# Patient Record
Sex: Male | Born: 1970 | Race: Black or African American | Hispanic: No | Marital: Married | Smoking: Never smoker
Health system: Southern US, Community
[De-identification: ages and names within clinical notes are randomized; demographics above are authoritative.]

## PROBLEM LIST (undated history)

## (undated) DIAGNOSIS — W3400XA Accidental discharge from unspecified firearms or gun, initial encounter: Secondary | ICD-10-CM

## (undated) HISTORY — PX: CHEST SURGERY: SHX595

## (undated) HISTORY — PX: EYE SURGERY: SHX253

---

## 1998-07-30 ENCOUNTER — Emergency Department (HOSPITAL_COMMUNITY): Admission: EM | Admit: 1998-07-30 | Discharge: 1998-07-30 | Payer: Self-pay | Admitting: Emergency Medicine

## 2005-06-25 ENCOUNTER — Emergency Department (HOSPITAL_COMMUNITY): Admission: EM | Admit: 2005-06-25 | Discharge: 2005-06-26 | Payer: Self-pay | Admitting: Emergency Medicine

## 2009-04-01 ENCOUNTER — Emergency Department (HOSPITAL_COMMUNITY): Admission: AC | Admit: 2009-04-01 | Discharge: 2009-04-01 | Payer: Self-pay

## 2011-01-24 LAB — TYPE AND SCREEN
ABO/RH(D): B POS
Antibody Screen: NEGATIVE

## 2011-01-24 LAB — CBC
HCT: 40.5 % (ref 39.0–52.0)
HCT: 40.8 % (ref 39.0–52.0)
Hemoglobin: 13.8 g/dL (ref 13.0–17.0)
Hemoglobin: 13.8 g/dL (ref 13.0–17.0)
Hemoglobin: 14.1 g/dL (ref 13.0–17.0)
MCHC: 33.9 g/dL (ref 30.0–36.0)
MCHC: 33.9 g/dL (ref 30.0–36.0)
MCHC: 34.3 g/dL (ref 30.0–36.0)
MCV: 86.9 fL (ref 78.0–100.0)
MCV: 87.6 fL — ABNORMAL LOW (ref 78.0–100.0)
RBC: 4.63 MIL/uL (ref 4.22–5.81)
RBC: 4.7 MIL/uL (ref 4.22–5.81)
RBC: 4.75 MIL/uL (ref 4.22–5.81)

## 2011-01-24 LAB — PROTIME-INR: Prothrombin Time: 16.3 seconds — ABNORMAL HIGH (ref 11.6–15.2)

## 2011-01-24 LAB — DIFFERENTIAL
Basophils Relative: 0 % (ref 0–1)
Eosinophils Absolute: 0 10*3/uL (ref 0.0–0.7)
Monocytes Absolute: 2.8 10*3/uL — ABNORMAL HIGH (ref 0.1–1.0)
Neutro Abs: 22.8 10*3/uL — ABNORMAL HIGH (ref 1.7–7.7)
Neutrophils Relative %: 82 % — ABNORMAL HIGH (ref 43–77)

## 2011-01-24 LAB — POCT I-STAT, CHEM 8
BUN: 12 mg/dL (ref 6–23)
Chloride: 101 meq/L (ref 96–112)
Glucose, Bld: 161 mg/dL — ABNORMAL HIGH (ref 70–99)
HCT: 41 % (ref 39.0–52.0)
Potassium: 2.7 meq/L — CL (ref 3.5–5.1)

## 2015-11-01 ENCOUNTER — Encounter (HOSPITAL_COMMUNITY): Payer: Self-pay | Admitting: *Deleted

## 2015-11-01 ENCOUNTER — Emergency Department (INDEPENDENT_AMBULATORY_CARE_PROVIDER_SITE_OTHER)
Admission: EM | Admit: 2015-11-01 | Discharge: 2015-11-01 | Disposition: A | Discharge status: Nursing Home (Includes ICF) | Payer: BLUE CROSS/BLUE SHIELD | Source: Home / Self Care | Attending: Emergency Medicine | Admitting: Emergency Medicine

## 2015-11-01 ENCOUNTER — Encounter (HOSPITAL_COMMUNITY): Payer: Self-pay | Admitting: Emergency Medicine

## 2015-11-01 ENCOUNTER — Emergency Department (HOSPITAL_COMMUNITY)
Admission: EM | Admit: 2015-11-01 | Discharge: 2015-11-01 | Disposition: A | Discharge status: Home / Self Care | Payer: BLUE CROSS/BLUE SHIELD | Attending: Emergency Medicine | Admitting: Emergency Medicine

## 2015-11-01 ENCOUNTER — Emergency Department (HOSPITAL_COMMUNITY): Payer: BLUE CROSS/BLUE SHIELD

## 2015-11-01 DIAGNOSIS — R0789 Other chest pain: Secondary | ICD-10-CM | POA: Insufficient documentation

## 2015-11-01 DIAGNOSIS — Z87828 Personal history of other (healed) physical injury and trauma: Secondary | ICD-10-CM | POA: Insufficient documentation

## 2015-11-01 DIAGNOSIS — R9431 Abnormal electrocardiogram [ECG] [EKG]: Secondary | ICD-10-CM

## 2015-11-01 DIAGNOSIS — R079 Chest pain, unspecified: Secondary | ICD-10-CM

## 2015-11-01 HISTORY — DX: Accidental discharge from unspecified firearms or gun, initial encounter: W34.00XA

## 2015-11-01 LAB — I-STAT TROPONIN, ED: TROPONIN I, POC: 0.01 ng/mL (ref 0.00–0.08)

## 2015-11-01 LAB — BASIC METABOLIC PANEL
Anion gap: 10 (ref 5–15)
BUN: 6 mg/dL (ref 6–20)
CALCIUM: 9.3 mg/dL (ref 8.9–10.3)
CHLORIDE: 103 mmol/L (ref 101–111)
CO2: 26 mmol/L (ref 22–32)
CREATININE: 1.01 mg/dL (ref 0.61–1.24)
GFR calc Af Amer: >60 mL/min (ref >60)
GFR calc non Af Amer: >60 mL/min (ref >60)
Glucose, Bld: 103 mg/dL — ABNORMAL HIGH (ref 65–99)
Potassium: 4 mmol/L (ref 3.5–5.1)
Sodium: 139 mmol/L (ref 135–145)

## 2015-11-01 LAB — CBC
HCT: 46.5 % (ref 39.0–52.0)
Hemoglobin: 16.2 g/dL (ref 13.0–17.0)
MCH: 28.7 pg (ref 26.0–34.0)
MCHC: 34.8 g/dL (ref 30.0–36.0)
MCV: 82.3 fL (ref 78.0–100.0)
PLATELETS: 224 10*3/uL (ref 150–400)
RBC: 5.65 MIL/uL (ref 4.22–5.81)
RDW: 11.9 % (ref 11.5–15.5)
WBC: 15.8 10*3/uL — ABNORMAL HIGH (ref 4.0–10.5)

## 2015-11-01 LAB — TROPONIN I: Troponin I: <0.03 ng/mL (ref <0.031)

## 2015-11-01 NOTE — Discharge Instructions (Signed)
As discussed, your evaluation today has been largely reassuring.  But, it is important that you monitor your condition carefully, and do not hesitate to return to the ED if you develop new, or concerning changes in your condition. ? ?Otherwise, please follow-up with your physician for appropriate ongoing care. ? ?

## 2015-11-01 NOTE — ED Notes (Signed)
Pt sent from urgent care for further eval of left sided chest pain onset 3 hours PTA. Pt reports pain onset while laying down. Pt denies any other symptoms.
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
## 2015-11-01 NOTE — Discharge Instructions (Signed)
You need to be seen in the ER for your chest pain

## 2015-11-01 NOTE — ED Notes (Signed)
Pt to be transferred to ED via private vehicle per PA.  [facility]

## 2017-09-26 ENCOUNTER — Ambulatory Visit (HOSPITAL_COMMUNITY)
Admission: EM | Admit: 2017-09-26 | Discharge: 2017-09-26 | Disposition: A | Discharge status: Home / Self Care | Payer: BLUE CROSS/BLUE SHIELD | Attending: Internal Medicine | Admitting: Internal Medicine

## 2017-09-26 ENCOUNTER — Encounter (HOSPITAL_COMMUNITY): Payer: Self-pay | Admitting: Emergency Medicine

## 2017-09-26 DIAGNOSIS — R109 Unspecified abdominal pain: Secondary | ICD-10-CM | POA: Insufficient documentation

## 2017-09-26 LAB — COMPREHENSIVE METABOLIC PANEL
ALT: 17 U/L (ref 17–63)
AST: 19 U/L (ref 15–41)
Albumin: 3.9 g/dL (ref 3.5–5.0)
Alkaline Phosphatase: 74 U/L (ref 38–126)
Anion gap: 6 (ref 5–15)
BUN: 8 mg/dL (ref 6–20)
CHLORIDE: 105 mmol/L (ref 101–111)
CO2: 27 mmol/L (ref 22–32)
CREATININE: 0.86 mg/dL (ref 0.61–1.24)
Calcium: 9.2 mg/dL (ref 8.9–10.3)
GFR calc Af Amer: >60 mL/min (ref >60)
GFR calc non Af Amer: >60 mL/min (ref >60)
Glucose, Bld: 95 mg/dL (ref 65–99)
POTASSIUM: 3.6 mmol/L (ref 3.5–5.1)
SODIUM: 138 mmol/L (ref 135–145)
Total Bilirubin: 1 mg/dL (ref 0.3–1.2)
Total Protein: 7.4 g/dL (ref 6.5–8.1)

## 2017-09-26 NOTE — ED Triage Notes (Signed)
PT reports "pressure" over RUQ. PT reports it improves in certain positions. PT denies N/V/D and constipation. This has been going on for 4 week.s

## 2017-09-26 NOTE — Discharge Instructions (Addendum)
We are check your liver and kidney function. We will call you if anything comes back abnormal.  In the mean time, please avoid any activity that make the pressure worse.   Please return if you start to experience any fever, nausea, vomiting, abdominal pain, chest pain, shortness of breath, changes in your symptoms.

## 2017-09-26 NOTE — ED Triage Notes (Signed)
Denies dysuria 
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
## 2018-11-01 ENCOUNTER — Other Ambulatory Visit: Payer: Self-pay

## 2018-11-01 ENCOUNTER — Emergency Department (HOSPITAL_COMMUNITY)
Admission: EM | Admit: 2018-11-01 | Discharge: 2018-11-01 | Disposition: A | Discharge status: Home / Self Care | Payer: BLUE CROSS/BLUE SHIELD | Attending: Emergency Medicine | Admitting: Emergency Medicine

## 2018-11-01 ENCOUNTER — Encounter (HOSPITAL_COMMUNITY): Payer: Self-pay | Admitting: Emergency Medicine

## 2018-11-01 DIAGNOSIS — S39012A Strain of muscle, fascia and tendon of lower back, initial encounter: Secondary | ICD-10-CM | POA: Diagnosis present

## 2018-11-01 DIAGNOSIS — Y999 Unspecified external cause status: Secondary | ICD-10-CM | POA: Diagnosis not present

## 2018-11-01 DIAGNOSIS — Y9389 Activity, other specified: Secondary | ICD-10-CM | POA: Insufficient documentation

## 2018-11-01 DIAGNOSIS — Y929 Unspecified place or not applicable: Secondary | ICD-10-CM | POA: Diagnosis not present

## 2018-11-01 MED ORDER — NAPROXEN 500 MG PO TABS: 500.0000 mg | ORAL_TABLET | Freq: Two times a day (BID) | ORAL | 0 refills | Status: AC | PRN

## 2018-11-01 MED ORDER — NAPROXEN 250 MG PO TABS
500.0000 mg | ORAL_TABLET | Freq: Once | ORAL | Status: AC
  Administered 2018-11-01: 500 mg via ORAL
  Filled 2018-11-01: qty 2

## 2018-11-01 MED ORDER — METHOCARBAMOL 500 MG PO TABS: 500.0000 mg | ORAL_TABLET | Freq: Three times a day (TID) | ORAL | 0 refills | Status: AC | PRN

## 2018-11-01 MED ORDER — METHOCARBAMOL 500 MG PO TABS
500.0000 mg | ORAL_TABLET | Freq: Once | ORAL | Status: AC
  Administered 2018-11-01: 500 mg via ORAL
  Filled 2018-11-01: qty 1

## 2018-11-01 NOTE — Discharge Instructions (Addendum)
Use the muscle relaxers and anti-inflammatories as prescribed.  Follow-up with your doctor.  Return to the ED with worsening pain, weakness, numbness, tingling or any other concerns.
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
## 2021-03-20 ENCOUNTER — Other Ambulatory Visit: Payer: Self-pay

## 2021-03-20 ENCOUNTER — Emergency Department (HOSPITAL_COMMUNITY): Payer: BC Managed Care – PPO

## 2021-03-20 ENCOUNTER — Emergency Department (HOSPITAL_COMMUNITY)
Admission: EM | Admit: 2021-03-20 | Discharge: 2021-03-20 | Disposition: A | Discharge status: Home / Self Care | Payer: BC Managed Care – PPO | Attending: Emergency Medicine | Admitting: Emergency Medicine

## 2021-03-20 ENCOUNTER — Encounter (HOSPITAL_COMMUNITY): Payer: Self-pay

## 2021-03-20 DIAGNOSIS — R0789 Other chest pain: Secondary | ICD-10-CM | POA: Diagnosis not present

## 2021-03-20 LAB — CBC
HCT: 47.7 % (ref 39.0–52.0)
Hemoglobin: 16.1 g/dL (ref 13.0–17.0)
MCH: 28.5 pg (ref 26.0–34.0)
MCHC: 33.8 g/dL (ref 30.0–36.0)
MCV: 84.4 fL (ref 80.0–100.0)
Platelets: 249 10*3/uL (ref 150–400)
RBC: 5.65 MIL/uL (ref 4.22–5.81)
RDW: 11.7 % (ref 11.5–15.5)
WBC: 11.3 10*3/uL — ABNORMAL HIGH (ref 4.0–10.5)
nRBC: 0 % (ref 0.0–0.2)

## 2021-03-20 LAB — D-DIMER, QUANTITATIVE: D-Dimer, Quant: 0.55 ug/mL-FEU — ABNORMAL HIGH (ref 0.00–0.50)

## 2021-03-20 LAB — BASIC METABOLIC PANEL
Anion gap: 6 (ref 5–15)
BUN: 8 mg/dL (ref 6–20)
CO2: 28 mmol/L (ref 22–32)
Calcium: 9.5 mg/dL (ref 8.9–10.3)
Chloride: 103 mmol/L (ref 98–111)
Creatinine, Ser: 1 mg/dL (ref 0.61–1.24)
GFR, Estimated: >60 mL/min (ref >60)
Glucose, Bld: 109 mg/dL — ABNORMAL HIGH (ref 70–99)
Potassium: 4.8 mmol/L (ref 3.5–5.1)
Sodium: 137 mmol/L (ref 135–145)

## 2021-03-20 LAB — TROPONIN I (HIGH SENSITIVITY)
Troponin I (High Sensitivity): 5 ng/L (ref <18)
Troponin I (High Sensitivity): 5 ng/L (ref <18)

## 2021-03-20 MED ORDER — IOHEXOL 350 MG/ML SOLN
75.0000 mL | Freq: Once | INTRAVENOUS | Status: AC | PRN
  Administered 2021-03-20: 75 mL via INTRAVENOUS

## 2021-03-20 NOTE — Discharge Instructions (Addendum)
The cause of your chest pain was not identified today. You may take ibuprofen, available over-the-counter according to label instructions as needed for pain. You had a CT scan performed today that showed enlargement of your thyroid gland on the right side. It is important for you to call your doctor to get further evaluation and workup for this. Get rechecked sooner if you develop new or concerning symptoms.

## 2021-03-20 NOTE — ED Triage Notes (Signed)
Patient complains of chest pain intermittently since yesterday. Denies radiation, appears in no distress. Patient denies any illness
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
## 2021-03-20 NOTE — ED Provider Notes (Signed)
Patient care assumed at 1500.  Pt here for evaluation of intermittent pleuritic chest pain since yesterday.  CTA pending.    CTA is negative for PE, pneumonia. It does have an incidental finding of enlargement of right side of his thyroid gland. Discussed this finding with patient importance of outpatient follow-up for further workup. Discussed home care for chest pain, unclear source. Discussed return precautions.   Tilden Fossa, MD 03/20/21 [phone]
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
## 2021-09-05 ENCOUNTER — Other Ambulatory Visit: Payer: Self-pay

## 2021-09-05 ENCOUNTER — Encounter (HOSPITAL_COMMUNITY): Payer: Self-pay

## 2021-09-05 ENCOUNTER — Ambulatory Visit (HOSPITAL_COMMUNITY)
Admission: EM | Admit: 2021-09-05 | Discharge: 2021-09-05 | Disposition: A | Discharge status: Home / Self Care | Payer: BC Managed Care – PPO | Attending: Student | Admitting: Student

## 2021-09-05 DIAGNOSIS — J039 Acute tonsillitis, unspecified: Secondary | ICD-10-CM | POA: Diagnosis present

## 2021-09-05 DIAGNOSIS — Z112 Encounter for screening for other bacterial diseases: Secondary | ICD-10-CM | POA: Diagnosis not present

## 2021-09-05 LAB — POCT RAPID STREP A, ED / UC: Streptococcus, Group A Screen (Direct): NEGATIVE

## 2021-09-05 MED ORDER — AMOXICILLIN-POT CLAVULANATE 875-125 MG PO TABS: 1.0000 | ORAL_TABLET | Freq: Two times a day (BID) | ORAL | 0 refills | Status: AC

## 2021-09-05 NOTE — ED Triage Notes (Signed)
Pt presents with c/o a sore throat x 3 weeks. States he has not taken any medications.

## 2021-09-05 NOTE — Discharge Instructions (Addendum)
-  Start the antibiotic-Augmentin (amoxicillin-clavulanate), 1 pill every 12 hours for 7 days.  You can take this with food like with breakfast and dinner. -You can take Tylenol up to 1000 mg 3 times daily, and ibuprofen up to 600 mg 3 times daily with food.  You can take these together, or alternate every 3-4 hours.  
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
## 2021-09-08 LAB — CULTURE, GROUP A STREP (THRC)
